# Patient Record
Sex: Male | Born: 2000 | Race: White | Hispanic: No | Marital: Single
Health system: Southern US, Community
[De-identification: ages and names within clinical notes are randomized; demographics above are authoritative.]

## PROBLEM LIST (undated history)

## (undated) DIAGNOSIS — R569 Unspecified convulsions: Secondary | ICD-10-CM

## (undated) HISTORY — DX: Unspecified convulsions: R56.9

---
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
## 2020-02-05 ENCOUNTER — Telehealth (INDEPENDENT_AMBULATORY_CARE_PROVIDER_SITE_OTHER): Payer: Self-pay | Admitting: Pediatrics

## 2020-02-05 DIAGNOSIS — Z79899 Other long term (current) drug therapy: Secondary | ICD-10-CM

## 2020-02-05 DIAGNOSIS — G40309 Generalized idiopathic epilepsy and epileptic syndromes, not intractable, without status epilepticus: Secondary | ICD-10-CM

## 2020-02-05 NOTE — Telephone Encounter (Signed)
I called and talked to Mom. She said that she is not sure if Troy Benton has missed doses of medication. He has had some nights without enough sleep because of his work schedule and they are working on that. I talked to Mom about the Levetiracetam and explained that his dose is fairly high and that we need to consider adding a second medication to his regimen if Troy Benton has been compliant. I recommended blood test to check Levetiracetam level and told Mom that I will call here when I receive the results. I faxed an order to [facility] at [phone] and Mom will take him to get trough level in the morning. I told Mom that Troy Benton needs to work at not missing doses and at getting enough sleep. I told her that he should not drive until cleared to do so. Mom agreed with these plans. TG

## 2020-02-05 NOTE — Telephone Encounter (Signed)
Spoke with mom about her phone message. She states that she is unsure how long the seizure lasted and the job did not inform her of the time length. She reports that he did not have to go to the hospital. She is not sure if he missed his medication doses that day.

## 2020-02-05 NOTE — Telephone Encounter (Signed)
  Who's calling (name and relationship to patient) :  Maralyn Sago ( mom)  Best contact number: [phone]  Provider they see: Dr. Sharene Skeans / Elveria Rising  Reason for call: mom called and LVM that patient had a seizure yesterday at work. They had to go and pick him up and he was resting when she called. She would like to speak to someone today.     PRESCRIPTION REFILL ONLY  Name of prescription:  Pharmacy:

## 2020-02-05 NOTE — Telephone Encounter (Signed)
I reviewed your note and agree with all recommendations that have been made.

## 2020-02-08 LAB — LEVETIRACETAM LEVEL: Levetiracetam Lvl: 15.7 ug/mL (ref 10.0–40.0)

## 2020-02-11 MED ORDER — LEVETIRACETAM 500 MG PO TABS: ORAL_TABLET | ORAL | 5 refills | Status: DC

## 2020-02-11 NOTE — Telephone Encounter (Signed)
I called and spoke with Mom about the Levetiracetam level. I explained that it was slightly lower than expected at 15.34mcg/ml. I recommended increasing the dose to 2 tablets TID and rechecking in 2 weeks after the increase. I sent in an updated Rx to the pharmacy and mailed Mom a blood test order for [facility]. I talked with Mom about Troy Benton's driving and explained that he can continue to practice driving with a licensed driver but that he should not drive alone until cleared to do so by this office. Mom agreed with the plans made today. TG

## 2020-02-11 NOTE — Addendum Note (Signed)
Addended by: Princella Ion on: 02/11/2020 09:17 AM   Modules accepted: Orders

## 2020-03-02 LAB — LEVETIRACETAM LEVEL: Levetiracetam Lvl: 23.2 ug/mL (ref 10.0–40.0)

## 2020-03-09 ENCOUNTER — Encounter (INDEPENDENT_AMBULATORY_CARE_PROVIDER_SITE_OTHER): Payer: Self-pay | Admitting: Family

## 2020-04-20 ENCOUNTER — Other Ambulatory Visit: Payer: Self-pay

## 2020-04-20 ENCOUNTER — Ambulatory Visit (INDEPENDENT_AMBULATORY_CARE_PROVIDER_SITE_OTHER): Payer: BC Managed Care – PPO | Admitting: Family

## 2020-04-20 ENCOUNTER — Encounter (INDEPENDENT_AMBULATORY_CARE_PROVIDER_SITE_OTHER): Payer: Self-pay | Admitting: Family

## 2020-04-20 VITALS — BP 110/80 | HR 60 | Ht 67.5 in | Wt 223.6 lb

## 2020-04-20 DIAGNOSIS — G4700 Insomnia, unspecified: Secondary | ICD-10-CM | POA: Diagnosis not present

## 2020-04-20 DIAGNOSIS — E669 Obesity, unspecified: Secondary | ICD-10-CM | POA: Diagnosis not present

## 2020-04-20 DIAGNOSIS — Z6834 Body mass index (BMI) 34.0-34.9, adult: Secondary | ICD-10-CM | POA: Diagnosis not present

## 2020-04-20 DIAGNOSIS — G40309 Generalized idiopathic epilepsy and epileptic syndromes, not intractable, without status epilepticus: Secondary | ICD-10-CM

## 2020-04-20 NOTE — Progress Notes (Signed)
Troy Benton   MRN:  [id number]  2001-01-30   Provider: Elveria Rising NP-C Location of Care: [facility] Child Neurology  Visit type: Routine visit  Last visit: 11/04/2019  Referral source: Sigmund Hazel, MD History from: patient, mother, and chcn chart  Brief history:  Copied from previous record: History of generalized tonic-clonic seizures. He is taking and tolerating Levetiracetam for his seizure disorder and hadbeen seizure free since August 2019 until he had a seizure on March 21, 2019. His most recent seizure occurred February 04, 2020 and the Levetiracetam dose was increased at that time. He also has problems with insomnia and takes Clonidine for that. He has problems with mood and is seeing a therapist and taking Sertraline. He has history of ADHD and has Adderall XR prescribed by his PCP.  Today's concerns:  Chey and his mother report today that he has remained seizure free since the Levetiracetam dose increase. He has tolerated the increase well and says that he has been compliant with medication. He works at [employer] and sometimes his schedule varies but he tries to get at least 8 hours of sleep each night. Primo is currently taking a semester off from school but plans to return in January. He has lost a little weight and Mom says that they are turning their garage into a home gym for the whole family to work out.   Bashar brought a [place] DMV form with him today for completion. He had trouble passing the written test in the past and is hopeful that he will be granted a license when he applies again.   Kael has been otherwise generally healthy since he was last seen. Neither he nor his mother have other health concerns for him today other than previously mentioned.  Review of systems: Please see HPI for neurologic and other pertinent review of systems. Otherwise all other systems were reviewed and were negative.  Problem List: Patient Active Problem List   Diagnosis Date Noted   . 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 Overweight (BMI 25.0-29.9) 11/22/2016  . Transient diplopia 09/03/2015  . Insomnia 02/06/2015  . Obesity 11/06/2014  . 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 Encounter for long-term (current) use of medications 08/18/2014     Past Medical History:  Diagnosis Date  . Seizures (HCC)     Past medical history comments: See HPI Copied from previous record: CT scan of the brain was negative.  EEG revealed two episodes of generalized high-voltage spike and slow wave activity of 3 Hz, these were only a second in duration. The background was otherwise normal.  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 Seizures began in November 2015. His last was on May 12, 2015. He was treated initially with lamotrigine, which was steadily increased. Levetiracetam was added and has proven to be effective thus far in preventing further seizures. Intermittent diplopia began after levetiracetam was titrated upwards. He experiences had between 9 and 10 in the morning. It seems more likely if he skips meals. It does not happen every day.  Birth History 7 lbs. 11 oz. infant born at [redacted] weeks gestational age to a 19 year old primigravida Gestation was uncomplicated Mother received Pitocin and Epidural anesthesia  Normal spontaneous vaginal delivery Nursery Course was uncomplicated Growth and Development was recalled asnormal  Surgical history: Past Surgical History:  Procedure Laterality Date  . CIRCUMCISION  2002  . WISDOM TOOTH EXTRACTION  10/13/2016  Family history: family history includes Alcoholism in his paternal grandfather; Seizures in his mother.   Social history: Social History   Socioeconomic History  . Marital status: Single    Spouse name: Not on file  . Number of children: Not on file  . Years of education: Not on file  . Highest education  level: Not on file  Occupational History  . Not on file  Tobacco Use  . Smoking status: Passive Smoke Exposure - Never Smoker  . Smokeless tobacco: Never Used  . Tobacco comment: Parents smoke outside only   Substance and Sexual Activity  . Alcohol use: No    Alcohol/week: 0.0 standard drinks  . Drug use: No  . Sexual activity: Never  Other Topics Concern  . Not on file  Social History Narrative   Lamond recently graduated from [employer]. He will be attending [employer] in the fall   Vanna lives at home with mom, dad and 1 younger brother   Ericson enjoys video games, football, and basketball but states seizures haven't allowed him to play as much or as well either.   Social Determinants of Health   Financial Resource Strain:   . Difficulty of Paying Living Expenses: Not on file  Food Insecurity:   . Worried About Programme researcher, broadcasting/film/video in the Last Year: Not on file  . Ran Out of Food in the Last Year: Not on file  Transportation Needs:   . Lack of Transportation (Medical): Not on file  . Lack of Transportation (Non-Medical): Not on file  Physical Activity:   . Days of Exercise per Week: Not on file  . Minutes of Exercise per Session: Not on file  Stress:   . Feeling of Stress : Not on file  Social Connections:   . Frequency of Communication with Friends and Family: Not on file  . Frequency of Social Gatherings with Friends and Family: Not on file  . Attends Religious Services: Not on file  . Active Member of Clubs or Organizations: Not on file  . Attends Banker Meetings: Not on file  . Marital Status: Not on file  Intimate Partner Violence:   . Fear of Current or Ex-Partner: Not on file  . Emotionally Abused: Not on file  . Physically Abused: Not on file  . Sexually Abused: Not on file    Past/failed meds: Copied from previous record Lamotrigine failed to control seizures  Allergies: No Known Allergies   Immunizations:  There is no immunization history  on file for this patient.   Diagnostics/Screenings: Copied from previous record: CT scan of the brain was negative.  EEG revealed two episodes of generalized high-voltage spike and slow wave activity of 3 Hz, these were only a second in duration. The background was otherwise normal. 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   Physical Exam: BP 110/80   Pulse 60   Ht 5' 7.5" (1.715 m)   Wt 223 lb 9.6 oz (101.4 kg)   BMI 34.50 kg/m   Wt Readings from Last 3 Encounters:  04/20/20 223 lb 9.6 oz (101.4 kg) (98 %, Z= 1.97)*  11/04/19 231 lb 9.6 oz (105.1 kg) (98 %, Z= 2.14)*  05/03/19 211 lb 6.4 oz (95.9 kg) (96 %, Z= 1.80)*   * Growth percentiles are based on CDC (Boys, 2-20 Years) data.   General: Well developed, well nourished obese young man, seated on exam table, in no evident distress, brown hair,  blue eyes, right handed Head: Head normocephalic and atraumatic.  Oropharynx benign. Neck: Supple Cardiovascular: Regular rate and rhythm, no murmurs Respiratory: Breath sounds clear to auscultation Musculoskeletal: No obvious deformities or scoliosis Skin: No rashes or neurocutaneous lesions  Neurologic Exam Mental Status: Awake and fully alert.  Oriented to place and time.  Recent and remote memory intact.  Attention span, concentration, and fund of knowledge appropriate.  Mood and affect appropriate. Cranial Nerves: Fundoscopic exam reveals sharp disc margins.  Pupils equal, briskly reactive to light.  Extraocular movements full without nystagmus.  Visual fields full to confrontation.  Hearing intact and symmetric to finger rub.  Facial sensation intact.  Face tongue, palate move normally and symmetrically.  Neck flexion and extension normal. Motor: Normal bulk and tone. Normal strength in all tested extremity muscles. Sensory: Intact to touch and temperature in all extremities.  Coordination: Rapid alternating movements normal in all  extremities.  Finger-to-nose and heel-to shin performed accurately bilaterally.  Romberg negative. Gait and Station: Arises from chair without difficulty.  Stance is normal. Gait demonstrates normal stride length and balance.   Able to heel, toe and tandem walk without difficulty. Reflexes: 1+ and symmetric. Toes downgoing.  Impression: 1. Generalized convulsive epilepsy 2. Difficulty initiating sleep 3. ADHD 4. Mood disorder  Recommendations for plan of care: The patient's previous [facility] records were reviewed. Dandy has neither had nor required imaging or lab studies since the last visit, other than lab draw in September. He and his mother are aware of those results. Mihir is a 19 year old young man with history of generalized convulsive epilepsy, difficulty initiating sleep, ADHD and mood disorder. His last seizure occurred February 04, 2020 in the setting of possible noncompliance. The dose was increased and he has been doing well since then. I reminded Jovontae of the need for compliance with medication as well as getting sufficient sleep. He brought a [place] DMV form to get his driver's license, which I completed and gave to him. Korban has lost some weight and I encouraged him to continue to work on healthy diet and exercise plan. I asked him to let me know if he has any seizures or any other concerns. I will otherwise see him back in follow up in 6 months or sooner if needed. Vonzell and his mother agreed with the plans made today.   The medication list was reviewed and reconciled. No changes were made in the prescribed medications today. A complete medication list was provided to the patient.  Allergies as of 04/20/2020   No Known Allergies     Medication List       Accurate as of April 20, 2020 11:09 AM. If you have any questions, ask your nurse or doctor.        STOP taking these medications   Melatonin 3 MG Caps Stopped by: Elveria Rising, NP   ondansetron 4 MG tablet Commonly known as:  Zofran Stopped by: Elveria Rising, NP     TAKE these medications   Adderall XR 20 MG 24 hr capsule Generic drug: amphetamine-dextroamphetamine Take 20 mg by mouth every morning.   cloNIDine 0.1 MG tablet Commonly known as: CATAPRES TAKE 2 TABLETS BY MOUTH EVERY DAY AT BEDTIME   levETIRAcetam 500 MG tablet Commonly known as: KEPPRA Take 2 tablets in the morning, 2 tablets at midday, and 2 tablets at nighttime   sertraline 50 MG tablet Commonly known as: ZOLOFT Take by mouth.       Total time spent  with the patient was 20 minutes, of which 50% or more was spent in counseling and coordination of care.  Elveria Rising NP-C [facility] Child Neurology Ph. [phone] Fax [phone]

## 2020-04-20 NOTE — Patient Instructions (Signed)
Thank you for coming in today.   Instructions for you until your next appointment are as follows: 1. Continue taking the Levetiracetam as prescribed. Try not to miss any doses 2. Remember that it is very important for you to get at least 8 hours of sleep each night as sleep deprivation can trigger seizures 3. Let me know if any seizures occur 4. I have completed the [facility] DMV form for you.  5. You have lost some weight today - keep working exercise and a healthy diet plan 6. Please sign up for MyChart if you have not done so 7. Please plan to return for follow up in 6 months or sooner if needed.

## 2020-05-04 ENCOUNTER — Other Ambulatory Visit (INDEPENDENT_AMBULATORY_CARE_PROVIDER_SITE_OTHER): Payer: Self-pay | Admitting: Family

## 2020-05-04 DIAGNOSIS — G40309 Generalized idiopathic epilepsy and epileptic syndromes, not intractable, without status epilepticus: Secondary | ICD-10-CM

## 2020-06-29 ENCOUNTER — Other Ambulatory Visit (INDEPENDENT_AMBULATORY_CARE_PROVIDER_SITE_OTHER): Payer: Self-pay | Admitting: Family

## 2020-06-29 DIAGNOSIS — G4700 Insomnia, unspecified: Secondary | ICD-10-CM

## 2020-06-29 NOTE — Telephone Encounter (Signed)
Please escribe

## 2020-06-29 NOTE — Telephone Encounter (Signed)
Patient was last seen on 04/2020.  Front desk please contact patient for follow up appointment with Inetta Fermo.

## 2020-08-20 ENCOUNTER — Telehealth (INDEPENDENT_AMBULATORY_CARE_PROVIDER_SITE_OTHER): Payer: Self-pay | Admitting: Family

## 2020-08-20 DIAGNOSIS — G40309 Generalized idiopathic epilepsy and epileptic syndromes, not intractable, without status epilepticus: Secondary | ICD-10-CM

## 2020-08-20 DIAGNOSIS — Z79899 Other long term (current) drug therapy: Secondary | ICD-10-CM

## 2020-08-20 NOTE — Telephone Encounter (Signed)
  Who's calling (name and relationship to patient) : Maralyn Sago (mom)  Best contact number: [phone]  Provider they see: Elveria Rising  Reason for call: Mom reports that patient had seizure at approximately 11:30 this morning lasting for 1 minute with loss of consciousness.    PRESCRIPTION REFILL ONLY  Name of prescription:  Pharmacy:

## 2020-08-20 NOTE — Telephone Encounter (Signed)
I called and talked to Mom. The number listed in the original message is incorrect. The area code is [phone].  Mom said that Troy Benton was at the grocery store shopping this morning when he fell to the floor in seizure. EMS was called and he was assessed but not taken to ER. Mom said that she thinks he has been missing doses of Levetiracetam. I recommended checking blood level and will fax order to [facility] in [place]. I will call Mom when I receive the results. She agreed with this plan. TG

## 2020-08-20 NOTE — Telephone Encounter (Signed)
I called and left a message for Mom, and asked her to call back. TG

## 2020-08-26 LAB — LEVETIRACETAM LEVEL: Levetiracetam Lvl: 16.6 ug/mL (ref 10.0–40.0)

## 2020-08-26 NOTE — Telephone Encounter (Signed)
I received the Levetiracetam level today and called results to Mom. It was slightly lower than in the past at 16.2mcg/ml. Mom reported that she had also learned that the night before the seizure last week, Troy Benton had gotten only 1 hour of sleep. Mom has talked with Melanee Spry about getting enough sleep and taking his medication. I told her that we can consider Levetiracetam XR at his next visit if it will help with compliance. Mom agreed with this plan. TG

## 2020-09-17 ENCOUNTER — Encounter (INDEPENDENT_AMBULATORY_CARE_PROVIDER_SITE_OTHER): Payer: Self-pay

## 2020-10-19 ENCOUNTER — Encounter (INDEPENDENT_AMBULATORY_CARE_PROVIDER_SITE_OTHER): Payer: Self-pay | Admitting: Family

## 2020-10-19 ENCOUNTER — Ambulatory Visit (INDEPENDENT_AMBULATORY_CARE_PROVIDER_SITE_OTHER): Payer: BC Managed Care – PPO | Admitting: Family

## 2020-10-19 ENCOUNTER — Other Ambulatory Visit: Payer: Self-pay

## 2020-10-19 DIAGNOSIS — G40309 Generalized idiopathic epilepsy and epileptic syndromes, not intractable, without status epilepticus: Secondary | ICD-10-CM | POA: Diagnosis not present

## 2020-10-19 MED ORDER — LEVETIRACETAM 500 MG PO TABS: ORAL_TABLET | ORAL | 1 refills | Status: DC

## 2020-10-19 NOTE — Progress Notes (Signed)
Troy Benton   MRN:  [id number]  17-Mar-2001   Provider: Elveria Rising NP-C Location of Care: [facility]rology  Visit type: Follow up   Last visit: 04/30/20  Referral source: Sigmund Hazel, MD History from: patient, mom, and [facility] Chart  Brief history:  Copied from previous record: History of generalized tonic-clonic seizures. He is taking and tolerating Levetiracetam for his seizure disorder and had been seizure free since August 2019 until he had a seizure on March 21, 2019. His most recent seizure occurred August 20, 2020 in the setting of missed medication and lack of sleep. He also has problems with insomnia and takes Clonidine for that. He has problems with mood and is seeing a therapist and taking Sertraline. He has history of ADHD and has Adderall XR prescribed by his PCP.  Today's concerns: Troy Benton and his mother report today that he has remained seizure free since August 20, 2020 when he had a seizure in the setting of missed medication and lack of sleep. Troy Benton says that he has not missed doses since that time. Troy Benton and his family have moved to [place] recently. He has a job interview at [employer] Troy Benton says that he likes his new home and is happy there.   Troy Benton has been otherwise generally healthy since he was last seen. Neither he nor his mother have other health concerns for him today other than previously mentioned.  Review of systems: Please see HPI for neurologic and other pertinent review of systems. Otherwise all other systems were reviewed and were negative.  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    EEG revealed two episodes of generalized high-voltage spike and slow wave activity of 3 Hz, these were only a second in duration.  The background was otherwise normal.   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  Seizures began in November 2015.  His last was on May 12, 2015.  He was treated initially with lamotrigine, which was steadily increased.  Levetiracetam was added and has proven to be effective thus far in preventing further seizures.  Intermittent diplopia began after levetiracetam was titrated upwards.  He experiences had between 9 and 10 in the morning.  It seems more likely if he skips meals.  It does not happen every day.   Birth History 7 lbs. 11 oz. infant born at [redacted] weeks gestational age to a 20 year old primigravida Gestation was uncomplicated Mother received Pitocin and Epidural anesthesia   Normal spontaneous vaginal delivery Nursery Course was uncomplicated Growth and Development was recalled as normal   Surgical history: Past Surgical History:  Procedure Laterality Date   CIRCUMCISION  2002   WISDOM TOOTH EXTRACTION  10/13/2016    Family history: family history includes Alcoholism in his paternal grandfather; Seizures in his mother.   Social history: Social History   Socioeconomic History   Marital status: Single    Spouse name: Not on file   Number of children: Not on file   Years of  education: Not on file   Highest education level: Not on file  Occupational History   Not on file  Tobacco Use   Smoking status: Passive Smoke Exposure - Never Smoker   Smokeless tobacco: Never Used   Tobacco comment: Parents smoke outside only   Substance and Sexual Activity   Alcohol use: No    Alcohol/week: 0.0 standard drinks   Drug use: No   Sexual activity: Never  Other Topics Concern   Not on file  Social  History Narrative   Troy Benton recently graduated from [employer]. He will be attending [employer] in the fall   Troy Benton lives at home with mom, dad and 1 younger brother   Troy Benton enjoys video games, football, and basketball but states seizures haven't allowed him to play as much or as well either.   Social Determinants of Health   Financial Resource Strain: Not on file  Food Insecurity: Not on file  Transportation Needs: Not on file  Physical Activity: Not on file  Stress: Not on file  Social Connections: Not on file  Intimate Partner Violence: Not on file   Past/failed meds: Copied from previous record: Lamotrigine failed to control seizures  Allergies: No Known Allergies   Immunizations:  There is no immunization history on file for this patient.    Diagnostics/Screenings: Copied from previous record: CT scan of the brain was negative.    EEG revealed two episodes of generalized high-voltage spike and slow wave activity of 3 Hz, these were only a second in duration.  The background was otherwise normal. 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    Physical Exam: BP 118/72   Pulse 76   Ht 5' 7.56" (1.716 m)   Wt 223 lb 12.3 oz (101.5 kg)   BMI 34.47 kg/m   Wt Readings from Last 3 Encounters:  10/19/20 223 lb 12.3 oz (101.5 kg) (97 %, Z= 1.94)*  04/20/20 223 lb 9.6 oz (101.4 kg) (98 %, Z= 1.97)*  11/04/19 231 lb 9.6 oz (105.1 kg) (98 %, Z= 2.14)*   * Growth percentiles are based on CDC (Boys, 2-20 Years) data.   General: Well developed, well nourished boy, seated on exam table, in no evident distress,  brown hair, blue eyes, right handed Head: Head normocephalic and atraumatic.  Oropharynx benign. Neck: Supple Cardiovascular: Regular rate and rhythm, no murmurs Respiratory: Breath sounds clear to auscultation Musculoskeletal: No obvious deformities or scoliosis Skin: No rashes or neurocutaneous lesions  Neurologic Exam Mental  Status: Awake and fully alert.  Oriented to place and time.  Recent and remote memory intact.  Attention span, concentration, and fund of knowledge appropriate.  Mood and affect appropriate. Cranial Nerves: Fundoscopic exam reveals sharp disc margins.  Pupils equal, briskly reactive to light.  Extraocular movements full without nystagmus.  Visual fields full to confrontation.  Hearing intact and symmetric to finger rub.  Facial sensation intact.  Face tongue, palate move normally and symmetrically.  Neck flexion and extension normal. Motor: Normal bulk and tone. Normal strength in all tested extremity muscles. Sensory: Intact to touch and temperature in all extremities.  Coordination: Rapid alternating movements normal in all extremities.  Finger-to-nose and heel-to shin performed accurately bilaterally.  Romberg negative. Gait and Station: Arises from chair without difficulty.  Stance is normal. Gait demonstrates normal stride length and balance.   Able to heel, toe and tandem walk without difficulty. Reflexes: 1+ and symmetric. Toes downgoing.  Impression: Epilepsy, generalized, convulsive (  HCC) - Plan: levETIRAcetam (KEPPRA) 500 MG tablet   Recommendations for plan of care: The patient's previous [facility] records were reviewed. Symon has neither had nor required imaging since the last visit. A Levetiracetam level was drawn in April and was within normal limits. Heyward is aware of those results. He is a 20 year old boy with history of generalized convulsive epilepsy. He is taking and tolerating Levetiracetam and has remained seizure free since August 20, 2020 when he had a seizure in the setting of missed doses and inadequate sleep. We talked about the need for compliance with medication and I suggested trying extended release Levetiracetam. Merl declined and wants to stay on his current regimen. I reminded him of the need for adequate sleep as sleep deprivation can trigger seizures. Creek and his family have moved  to [place] but he wants to continue care at this office for now. I will see him back in follow up in 6 months or sooner if needed. Qualyn and his mother agreed with the plans made today.20  The medication list was reviewed and reconciled. No changes were made in the prescribed medications today. A complete medication list was provided to the patient.  Return in about 6 months (around 04/20/2021).   Allergies as of 10/19/2020   No Known Allergies      Medication List        Accurate as of October 19, 2020 11:59 PM. If you have any questions, ask your nurse or doctor.          Adderall XR 20 MG 24 hr capsule Generic drug: amphetamine-dextroamphetamine Take 20 mg by mouth every morning.   cloNIDine 0.1 MG tablet Commonly known as: CATAPRES TAKE 2 TABLETS BY MOUTH AT BEDTIME   levETIRAcetam 500 MG tablet Commonly known as: KEPPRA TAKE 1.5 TABLET BY MOUTH IN THE MORNING, 1.5 AT MIDDAY, AND 2 AT NIGHTTIME   sertraline 50 MG tablet Commonly known as: ZOLOFT Take by mouth.        Total time spent with the patient was 20 minutes, of which 50% or more was spent in counseling and coordination of care.  Elveria Rising NP-C [facility] Ph. [phone] Fax [phone]

## 2020-10-22 ENCOUNTER — Other Ambulatory Visit (INDEPENDENT_AMBULATORY_CARE_PROVIDER_SITE_OTHER): Payer: Self-pay | Admitting: Family

## 2020-10-22 ENCOUNTER — Encounter (INDEPENDENT_AMBULATORY_CARE_PROVIDER_SITE_OTHER): Payer: Self-pay | Admitting: Family

## 2020-10-22 DIAGNOSIS — G40309 Generalized idiopathic epilepsy and epileptic syndromes, not intractable, without status epilepticus: Secondary | ICD-10-CM

## 2020-10-22 NOTE — Patient Instructions (Signed)
Thank you for coming in today.   Instructions for you until your next appointment are as follows: Continue taking the Levetiracetam as prescribed. Try not to miss any doses.  Remember that it is important for you to get at least 8 hours of sleep every night as sleep deprivation can trigger seizures. Let me know if any seizures occur Please sign up for MyChart if you have not done so. 5. Please plan to return for follow up in 6 months or sooner if needed.  At [facility], we are committed to providing exceptional care. You will receive a patient satisfaction survey through text or email regarding your visit today. Your opinion is important to me. Comments are appreciated.

## 2020-11-09 ENCOUNTER — Telehealth (INDEPENDENT_AMBULATORY_CARE_PROVIDER_SITE_OTHER): Payer: Self-pay | Admitting: Family

## 2020-11-09 ENCOUNTER — Other Ambulatory Visit (INDEPENDENT_AMBULATORY_CARE_PROVIDER_SITE_OTHER): Payer: Self-pay | Admitting: Family

## 2020-11-09 DIAGNOSIS — G40309 Generalized idiopathic epilepsy and epileptic syndromes, not intractable, without status epilepticus: Secondary | ICD-10-CM

## 2020-11-09 DIAGNOSIS — G4700 Insomnia, unspecified: Secondary | ICD-10-CM

## 2020-11-09 MED ORDER — CLONIDINE HCL 0.1 MG PO TABS: ORAL_TABLET | ORAL | 1 refills | Status: DC

## 2020-11-09 NOTE — Telephone Encounter (Signed)
I called and spoke with Mom. She said that Troy Benton was not injured and that he was resting now. I talked with Mom about trying the extended release Levetiracetam and/or adding another medication. I recommended allowing Andry to rest, then talking with him about it later today. I will call Mom tomorrow to discuss further. She agreed with this plan. TG

## 2020-11-09 NOTE — Telephone Encounter (Signed)
  Who's calling (name and relationship to patient) :Troy Benton, Troy Benton (Mother)  Best contact number: [phone] (Home) Provider they see: Elveria Rising, NP Reason for call: [name] just had a seizure and mom wanted Goodpasture to be aware, [name] stated he wasn't feeling good and his head was spinning. Seizure is said to have lasted normal time frame. Middle medication was taken a little late however all other medication were taken on time.     PRESCRIPTION REFILL ONLY  Name of prescription:  Pharmacy:

## 2020-11-10 MED ORDER — LEVETIRACETAM ER 750 MG PO TB24: ORAL_TABLET | ORAL | 5 refills | Status: DC

## 2020-11-10 NOTE — Telephone Encounter (Signed)
I called and spoke with Mom. She said that she had discussed medication with Melanee Spry and he wants to try Levetiracetam XR for now. I agreed but told Mom that if he has more seizures that we will need to add a second medication. I sent in Rx for Levetiracetam XR 750mg  - 2 tablets BID. TG

## 2020-12-02 ENCOUNTER — Other Ambulatory Visit (INDEPENDENT_AMBULATORY_CARE_PROVIDER_SITE_OTHER): Payer: Self-pay | Admitting: Family

## 2020-12-02 DIAGNOSIS — G40309 Generalized idiopathic epilepsy and epileptic syndromes, not intractable, without status epilepticus: Secondary | ICD-10-CM

## 2021-02-08 ENCOUNTER — Telehealth (INDEPENDENT_AMBULATORY_CARE_PROVIDER_SITE_OTHER): Payer: Self-pay | Admitting: Family

## 2021-02-08 NOTE — Telephone Encounter (Signed)
Telephone  MRN: [id number]   Pts mom called, stated that Reymond has had another seizure that lasted for about a min. She would like for you to return her call at  [phone].

## 2021-02-08 NOTE — Telephone Encounter (Signed)
I called and spoke with patient's mother. She said that he was at [place] football game and while tailgating he suddenly told his mother that he didn't feel well then had a seizure that lasted about 1 min. Mom does not think that he slept well the night before, then at the party he had 2 Fireball shots. Troy Benton also told his mother that he was experiencing more anxiety than usual - some of it was about the game and wanting his team to do well, and some of it was social, in that he felt that everyone was looking at him. Mom said that as far as she knows he has been compliant with medication. I told Mom that the lack of sleep, the alcohol and the anxiety may have all contributed to his seizure. I recommended no change in medication right now. I recommended no more alcohol and getting him in with a provider for the anxiety. Mom said that he was taking Sertraline (prescribed by another provider) but that the dose had not been changed in a long time. Mom agreed to work on getting him in with a provider for anxiety. I asked her to call me if [name] has more seizures. TG

## 2021-02-08 NOTE — Telephone Encounter (Signed)
Left voicemail for mom to call back

## 2021-03-06 ENCOUNTER — Other Ambulatory Visit (INDEPENDENT_AMBULATORY_CARE_PROVIDER_SITE_OTHER): Payer: Self-pay | Admitting: Family

## 2021-03-06 DIAGNOSIS — G40309 Generalized idiopathic epilepsy and epileptic syndromes, not intractable, without status epilepticus: Secondary | ICD-10-CM

## 2021-04-16 ENCOUNTER — Other Ambulatory Visit (INDEPENDENT_AMBULATORY_CARE_PROVIDER_SITE_OTHER): Payer: Self-pay | Admitting: Family

## 2021-04-16 DIAGNOSIS — G40309 Generalized idiopathic epilepsy and epileptic syndromes, not intractable, without status epilepticus: Secondary | ICD-10-CM

## 2021-04-26 ENCOUNTER — Encounter (INDEPENDENT_AMBULATORY_CARE_PROVIDER_SITE_OTHER): Payer: Self-pay | Admitting: Neurology

## 2021-04-26 ENCOUNTER — Ambulatory Visit (INDEPENDENT_AMBULATORY_CARE_PROVIDER_SITE_OTHER): Payer: BLUE CROSS/BLUE SHIELD | Admitting: Neurology

## 2021-04-26 ENCOUNTER — Other Ambulatory Visit: Payer: Self-pay

## 2021-04-26 VITALS — BP 130/80 | HR 84 | Wt 244.2 lb

## 2021-04-26 DIAGNOSIS — G40309 Generalized idiopathic epilepsy and epileptic syndromes, not intractable, without status epilepticus: Secondary | ICD-10-CM

## 2021-04-26 DIAGNOSIS — G4700 Insomnia, unspecified: Secondary | ICD-10-CM

## 2021-04-26 MED ORDER — LEVETIRACETAM ER 750 MG PO TB24
ORAL_TABLET | ORAL | 1 refills | Status: AC
Start: 2021-04-26 — End: ?

## 2021-04-26 NOTE — Progress Notes (Signed)
Patient: Troy Benton MRN: [id number] Sex: male DOB: 2001-04-05  Provider: Keturah Shavers, MD Location of Care: [facility]  Note type: Routine return visit  Referral Source: Sigmund Hazel, MD History from: mother and [facility] chart Chief Complaint: Seizures  History of Present Illness: Troy Benton is a 20 y.o. male is here for follow-up management of seizure disorder.  He has a diagnosis of generalized seizure disorder since 79 of years of age for which he has been on seizure medication, initially Lamictal and then started on Keppra for which he has been on for the past few years with current dose of 3000 mg daily. He was seizure-free for a couple of years but then he has had sporadic breakthrough seizures, some of them due to missing dose of medication and some without any specific reason or trigger. He has been seen and followed by Dr. Sharene Skeans and 10 of the past year over the past several years with the last visit was about 6 months ago.  He has not had any recent EEG with the last EEG was in 2018 with normal result.  He did have a normal head CT in the past. Over the past year of 2022 apparently he has had 2 breakthrough seizure activity with the last one in September, each lasted for around 1 or 2 minutes and resolved spontaneously and one of them was due to missing dose of medication. Currently he is not going to school and he is not working and stays home and usually plays video game throughout the day.  He has been taking medication regularly without any missing doses. He also has anxiety and depression and ADHD as well as sleep difficulty and has been on stimulant medication and clonidine as well as Zoloft with some help. Recently they have moved to [place] but he is coming back to this area for his neurology appointment.  Mother has no other complaints or concerns at this time.   Review of Systems: Review of system as per HPI, otherwise negative.  Past Medical History:   Diagnosis Date   Seizures (HCC)    Hospitalizations: No., Head Injury: No., Nervous System Infections: No., Immunizations up to date: Yes.     Surgical History Past Surgical History:  Procedure Laterality Date   CIRCUMCISION  2002   WISDOM TOOTH EXTRACTION  10/13/2016    Family History family history includes Alcoholism in his paternal grandfather; Seizures in his mother.   Social History Social History   Socioeconomic History   Marital status: Single    Spouse name: Not on file   Number of children: Not on file   Years of education: Not on file   Highest education level: Not on file  Occupational History   Not on file  Tobacco Use   Smoking status: Passive Smoke Exposure - Never Smoker   Smokeless tobacco: Never   Tobacco comments:    Parents smoke outside only   Substance and Sexual Activity   Alcohol use: No    Alcohol/week: 0.0 standard drinks   Drug use: No   Sexual activity: Never  Other Topics Concern   Not on file  Social History Narrative   Vincenzo recently graduated from [employer]. He will be attending [employer] in the fall   Mansa lives at home with mom, dad and 1 younger brother   Dhyan enjoys video games, football, and basketball but states seizures haven't allowed him to play as much or as well either.   Social Determinants  of Health   Financial Resource Strain: Not on file  Food Insecurity: Not on file  Transportation Needs: Not on file  Physical Activity: Not on file  Stress: Not on file  Social Connections: Not on file     No Known Allergies  Physical Exam BP 130/80   Pulse 84   Wt 244 lb 3.2 oz (110.8 kg)   BMI 37.62 kg/m  Gen: Awake, alert, not in distress Skin: No rash, No neurocutaneous stigmata. HEENT: Normocephalic, no dysmorphic features, no conjunctival injection, nares patent, mucous membranes moist, oropharynx clear. Neck: Supple, no meningismus. No focal tenderness. Resp: Clear to auscultation bilaterally CV: Regular  rate, normal S1/S2, no murmurs, no rubs Abd: BS present, abdomen soft, non-tender, non-distended. No hepatosplenomegaly or mass Ext: Warm and well-perfused. No deformities, no muscle wasting, ROM full.  Neurological Examination: MS: Awake, alert, interactive. Normal eye contact, answered the questions appropriately, speech was fluent,  Normal comprehension.  Attention and concentration were normal. Cranial Nerves: Pupils were equal and reactive to light ( 5-4mm);  normal fundoscopic exam with sharp discs, visual field full with confrontation test; EOM normal, no nystagmus; no ptsosis, no double vision, intact facial sensation, face symmetric with full strength of facial muscles, hearing intact to finger rub bilaterally, palate elevation is symmetric, tongue protrusion is symmetric with full movement to both sides.  Sternocleidomastoid and trapezius are with normal strength. Tone-Normal Strength-Normal strength in all muscle groups DTRs-  Biceps Triceps Brachioradialis Patellar Ankle  R 2+ 2+ 2+ 2+ 2+  L 2+ 2+ 2+ 2+ 2+   Plantar responses flexor bilaterally, no clonus noted Sensation: Intact to light touch, temperature, vibration, Romberg negative. Coordination: No dysmetria on FTN test. No difficulty with balance. Gait: Normal walk and run. Tandem gait was normal. Was able to perform toe walking and heel walking without difficulty.   Assessment and Plan 1. Epilepsy, generalized, convulsive (HCC)   2. Insomnia, unspecified type    This is a 20 year old male with diagnosis of generalized seizure disorder since age 43, currently on Keppra at 3000 mg daily with fairly good seizure control and no side effects.  He also has ADHD, anxiety and depressed mood as well as some sleep difficulty and has been on some other medications. I discussed with patient and his mother that at this time I would recommend to continue seizure medication at the same dose but I would like to have a follow-up EEG done to  evaluate for epileptiform discharges.  We did have a slot for EEG today but mother did not want to do the EEG today and would like to come back from [place] in February to do the EEG. I discussed with mother that it would be better for him to get a referral from his PCP to see adult neurology in the area that they live so they do not need to travel all the way to here. I also discussed regarding the seizure triggers particularly lack of sleep and bright light and prolonged screen time so he needs to have adequate sleep with normal electronic at bedtime and also have limited screen time overall. He needs to continue follow-up with primary care physician and possibly psychiatry for managing other issues including depression, anxiety and ADHD. He needs to have regular physical activity and try to avoid weight gain I will schedule for an EEG and an appointment in February to discuss the EEG results and adjust the dose of medication but if he would be able to see adult neurology  in his hometown in [place] , there would be no need to follow-up with our clinic.  Mother understood and agreed with the plan.   Meds ordered this encounter  Medications   Levetiracetam 750 MG TB24    Sig: TAKE 2 TABLETS IN THE MORNING AND 2 TABS AT NIGHT    Dispense:  120 tablet    Refill:  1   Orders Placed This Encounter  Procedures   EEG adult    Standing Status:   Future    Standing Expiration Date:   04/26/2022    Scheduling Instructions:     Schedule at the same time with the next appointment    Order Specific Question:   Where should this test be performed?    Answer:   [facility]    Order Specific Question:   Reason for exam    Answer:   Seizure

## 2021-04-26 NOTE — Patient Instructions (Addendum)
Continue Keppra at the same dose of 1500 mg twice daily We will schedule for EEG to be done over the next couple of months Continue with adequate sleep and limiting screen time Have regular exercise Get a referral from your primary care physician to see adult neurology over the next few months Will return in 2 months for perform an EEG, to discuss the EEG result and refilling the medication

## 2021-04-28 ENCOUNTER — Other Ambulatory Visit (INDEPENDENT_AMBULATORY_CARE_PROVIDER_SITE_OTHER): Payer: Self-pay | Admitting: Family

## 2021-04-28 DIAGNOSIS — G4700 Insomnia, unspecified: Secondary | ICD-10-CM

## 2021-06-18 ENCOUNTER — Telehealth (INDEPENDENT_AMBULATORY_CARE_PROVIDER_SITE_OTHER): Payer: Self-pay | Admitting: Neurology

## 2021-06-18 NOTE — Telephone Encounter (Signed)
°  Who's calling (name and relationship to patient) :Troy Benton / mom   Best contact number:[phone]  Provider they see:Dr. NAB   Reason for call:mom called to let the office know they will no longer be seen in the office they have moved out of state     PRESCRIPTION REFILL ONLY  Name of prescription:  Pharmacy:

## 2021-06-28 ENCOUNTER — Ambulatory Visit (INDEPENDENT_AMBULATORY_CARE_PROVIDER_SITE_OTHER): Payer: BLUE CROSS/BLUE SHIELD | Admitting: Neurology

## 2021-06-28 ENCOUNTER — Other Ambulatory Visit (INDEPENDENT_AMBULATORY_CARE_PROVIDER_SITE_OTHER): Payer: BLUE CROSS/BLUE SHIELD
